# Patient Record
Sex: Female | Born: 1955 | Race: White | Hispanic: No | Smoking: Never smoker
Health system: Southern US, Community
[De-identification: ages and names within clinical notes are randomized; demographics above are authoritative.]

## PROBLEM LIST (undated history)

## (undated) DIAGNOSIS — Q638 Other specified congenital malformations of kidney: Secondary | ICD-10-CM

## (undated) DIAGNOSIS — T7840XA Allergy, unspecified, initial encounter: Secondary | ICD-10-CM

## (undated) DIAGNOSIS — I10 Essential (primary) hypertension: Secondary | ICD-10-CM

## (undated) DIAGNOSIS — J029 Acute pharyngitis, unspecified: Secondary | ICD-10-CM

## (undated) DIAGNOSIS — B001 Herpesviral vesicular dermatitis: Secondary | ICD-10-CM

## (undated) HISTORY — PX: TUBAL LIGATION: SHX77

## (undated) HISTORY — DX: Other specified congenital malformations of kidney: Q63.8

## (undated) HISTORY — DX: Herpesviral vesicular dermatitis: B00.1

## (undated) HISTORY — DX: Allergy, unspecified, initial encounter: T78.40XA

## (undated) HISTORY — DX: Essential (primary) hypertension: I10

## (undated) HISTORY — DX: Acute pharyngitis, unspecified: J02.9

---

## 1998-06-07 ENCOUNTER — Other Ambulatory Visit: Admission: RE | Admit: 1998-06-07 | Discharge: 1998-06-07 | Payer: Self-pay | Admitting: Obstetrics and Gynecology

## 1999-02-05 ENCOUNTER — Other Ambulatory Visit: Admission: RE | Admit: 1999-02-05 | Discharge: 1999-02-05 | Payer: Self-pay | Admitting: *Deleted

## 2000-02-09 ENCOUNTER — Other Ambulatory Visit: Admission: RE | Admit: 2000-02-09 | Discharge: 2000-02-09 | Payer: Self-pay | Admitting: *Deleted

## 2001-03-29 ENCOUNTER — Other Ambulatory Visit: Admission: RE | Admit: 2001-03-29 | Discharge: 2001-03-29 | Payer: Self-pay | Admitting: *Deleted

## 2002-07-03 ENCOUNTER — Other Ambulatory Visit: Admission: RE | Admit: 2002-07-03 | Discharge: 2002-07-03 | Payer: Self-pay | Admitting: Obstetrics & Gynecology

## 2003-08-08 ENCOUNTER — Other Ambulatory Visit: Admission: RE | Admit: 2003-08-08 | Discharge: 2003-08-08 | Payer: Self-pay | Admitting: Obstetrics & Gynecology

## 2004-08-01 ENCOUNTER — Emergency Department (HOSPITAL_COMMUNITY): Admission: EM | Admit: 2004-08-01 | Discharge: 2004-08-01 | Payer: Self-pay | Admitting: Emergency Medicine

## 2008-04-20 ENCOUNTER — Ambulatory Visit: Payer: Self-pay | Admitting: Family Medicine

## 2008-07-06 ENCOUNTER — Ambulatory Visit: Payer: Self-pay | Admitting: Family Medicine

## 2008-07-20 ENCOUNTER — Ambulatory Visit: Payer: Self-pay | Admitting: Family Medicine

## 2008-10-22 ENCOUNTER — Ambulatory Visit: Payer: Self-pay | Admitting: Family Medicine

## 2008-11-19 LAB — HM COLONOSCOPY: HM Colonoscopy: NORMAL

## 2009-05-17 ENCOUNTER — Ambulatory Visit: Payer: Self-pay | Admitting: Family Medicine

## 2009-08-03 LAB — HM DEXA SCAN

## 2009-09-09 ENCOUNTER — Ambulatory Visit: Payer: Self-pay | Admitting: Family Medicine

## 2010-02-11 ENCOUNTER — Ambulatory Visit: Payer: Self-pay | Admitting: Family Medicine

## 2010-08-27 ENCOUNTER — Ambulatory Visit: Payer: Self-pay | Admitting: Family Medicine

## 2010-11-09 DIAGNOSIS — Q638 Other specified congenital malformations of kidney: Secondary | ICD-10-CM

## 2010-11-09 HISTORY — DX: Other specified congenital malformations of kidney: Q63.8

## 2010-12-14 ENCOUNTER — Emergency Department (HOSPITAL_COMMUNITY)
Admission: EM | Admit: 2010-12-14 | Discharge: 2010-12-14 | Disposition: A | Discharge status: Home / Self Care | Payer: BC Managed Care – PPO | Attending: Emergency Medicine | Admitting: Emergency Medicine

## 2010-12-14 ENCOUNTER — Emergency Department (HOSPITAL_COMMUNITY): Payer: BC Managed Care – PPO

## 2010-12-14 DIAGNOSIS — I10 Essential (primary) hypertension: Secondary | ICD-10-CM | POA: Insufficient documentation

## 2010-12-14 DIAGNOSIS — N200 Calculus of kidney: Secondary | ICD-10-CM | POA: Insufficient documentation

## 2010-12-14 DIAGNOSIS — R109 Unspecified abdominal pain: Secondary | ICD-10-CM | POA: Insufficient documentation

## 2010-12-14 DIAGNOSIS — N1330 Unspecified hydronephrosis: Secondary | ICD-10-CM | POA: Insufficient documentation

## 2010-12-14 DIAGNOSIS — R935 Abnormal findings on diagnostic imaging of other abdominal regions, including retroperitoneum: Secondary | ICD-10-CM | POA: Insufficient documentation

## 2010-12-14 DIAGNOSIS — F411 Generalized anxiety disorder: Secondary | ICD-10-CM | POA: Insufficient documentation

## 2010-12-14 DIAGNOSIS — Q628 Other congenital malformations of ureter: Secondary | ICD-10-CM | POA: Insufficient documentation

## 2010-12-14 LAB — URINALYSIS, ROUTINE W REFLEX MICROSCOPIC
Bilirubin Urine: NEGATIVE
Hgb urine dipstick: NEGATIVE
Ketones, ur: NEGATIVE mg/dL
Nitrite: NEGATIVE
Protein, ur: NEGATIVE mg/dL
Specific Gravity, Urine: 1.019 (ref 1.005–1.030)
Urine Glucose, Fasting: NEGATIVE mg/dL
Urobilinogen, UA: 0.2 mg/dL (ref 0.0–1.0)
pH: 7 (ref 5.0–8.0)

## 2010-12-14 LAB — POCT I-STAT, CHEM 8
BUN: 17 mg/dL (ref 6–23)
Calcium, Ion: 1.11 mmol/L — ABNORMAL LOW (ref 1.12–1.32)
Chloride: 105 mEq/L (ref 96–112)
Creatinine, Ser: 1.2 mg/dL (ref 0.4–1.2)
Glucose, Bld: 119 mg/dL — ABNORMAL HIGH (ref 70–99)
HCT: 42 % (ref 36.0–46.0)
Hemoglobin: 14.3 g/dL (ref 12.0–15.0)
Potassium: 3.7 mEq/L (ref 3.5–5.1)
Sodium: 141 mEq/L (ref 135–145)
TCO2: 26 mmol/L (ref 0–100)

## 2010-12-15 LAB — URINE CULTURE
Colony Count: NO GROWTH
Culture  Setup Time: 201202051117
Culture: NO GROWTH

## 2010-12-17 ENCOUNTER — Other Ambulatory Visit: Payer: Self-pay | Admitting: Family Medicine

## 2010-12-17 DIAGNOSIS — K8689 Other specified diseases of pancreas: Secondary | ICD-10-CM

## 2010-12-19 ENCOUNTER — Ambulatory Visit
Admission: RE | Admit: 2010-12-19 | Discharge: 2010-12-19 | Disposition: A | Discharge status: Home / Self Care | Payer: BC Managed Care – PPO | Source: Ambulatory Visit | Attending: Family Medicine | Admitting: Family Medicine

## 2010-12-19 DIAGNOSIS — K8689 Other specified diseases of pancreas: Secondary | ICD-10-CM

## 2010-12-19 MED ORDER — IOHEXOL 350 MG/ML SOLN
100.0000 mL | Freq: Once | INTRAVENOUS | Status: AC | PRN
  Administered 2010-12-19: 100 mL via INTRAVENOUS

## 2010-12-22 ENCOUNTER — Other Ambulatory Visit (HOSPITAL_COMMUNITY): Payer: BC Managed Care – PPO

## 2011-01-02 ENCOUNTER — Ambulatory Visit (HOSPITAL_COMMUNITY): Payer: BC Managed Care – PPO | Attending: Urology

## 2011-01-02 ENCOUNTER — Ambulatory Visit (HOSPITAL_BASED_OUTPATIENT_CLINIC_OR_DEPARTMENT_OTHER)
Admission: RE | Admit: 2011-01-02 | Discharge: 2011-01-02 | Disposition: A | Discharge status: Home / Self Care | Payer: BC Managed Care – PPO | Source: Ambulatory Visit | Attending: Urology | Admitting: Urology

## 2011-01-02 DIAGNOSIS — N1370 Vesicoureteral-reflux, unspecified: Secondary | ICD-10-CM | POA: Insufficient documentation

## 2011-01-02 DIAGNOSIS — Z01818 Encounter for other preprocedural examination: Secondary | ICD-10-CM | POA: Insufficient documentation

## 2011-01-02 DIAGNOSIS — N2889 Other specified disorders of kidney and ureter: Secondary | ICD-10-CM | POA: Insufficient documentation

## 2011-01-02 DIAGNOSIS — N200 Calculus of kidney: Secondary | ICD-10-CM | POA: Insufficient documentation

## 2011-01-02 DIAGNOSIS — Z87891 Personal history of nicotine dependence: Secondary | ICD-10-CM | POA: Insufficient documentation

## 2011-01-02 DIAGNOSIS — Q649 Congenital malformation of urinary system, unspecified: Secondary | ICD-10-CM | POA: Insufficient documentation

## 2011-01-02 DIAGNOSIS — I10 Essential (primary) hypertension: Secondary | ICD-10-CM | POA: Insufficient documentation

## 2011-01-02 LAB — POCT I-STAT 4, (NA,K, GLUC, HGB,HCT)
Glucose, Bld: 100 mg/dL — ABNORMAL HIGH (ref 70–99)
HCT: 41 % (ref 36.0–46.0)
Hemoglobin: 13.9 g/dL (ref 12.0–15.0)
Potassium: 3.4 mEq/L — ABNORMAL LOW (ref 3.5–5.1)
Sodium: 143 mEq/L (ref 135–145)

## 2011-01-05 NOTE — Op Note (Signed)
[name], [name]                 ACCOUNT NO.:  [id number]  MEDICAL RECORD NO.:  [id number]           PATIENT TYPE:  E  LOCATION:  [facility]                         FACILITY:  [facility]  PHYSICIAN:  Lanise Mergen C. Vernie Shea, M.D.  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 3. Left renal calculi.  POSTOPERATIVE DIAGNOSES: 1. Complete duplication on the left-hand side. 2. Left lower pole moiety vesicoureteral reflux. 3. Left upper pole moiety ureterocele. 4. Left upper pole moiety calculi.  PROCEDURE: 1. Cystoscopy with retrograde pyelogram including interpretation. 2. Diagnostic ureteroscopy of the lower pole moiety. 3. Incision of upper pole moiety ureterocele. 4. Upper pole ureteroscopy with stone extraction.  SURGEON:  Dajah Fischman C. Vernie Ammons, MD  ANESTHESIA:  General.  BLOOD LOSS:  Less than 5 cc.  DRAINS:  None. SPECIMENS:  None.  COMPLICATIONS:  None.  INDICATIONS:  The patient is a 55 year old female who developed left flank pain acutely.  A CT scan revealed what appeared to be duplication that was most likely complete with an associated ureterocele and loss of renal parenchyma as well as renal calculi.  She also appeared to have ureteral calculi and I, therefore, have discussed the treatment options with her and she elected to proceed with surgical treatment.  DESCRIPTION OF OPERATION:  After informed consent, the patient was brought to the major OR, placed on the table, administered general anesthesia and then moved to the dorsal lithotomy position.  Genitalia was sterilely prepped and draped and an official time-out was then performed.  Initially, a 22-French cystoscope was passed into the bladder and the bladder was fully and systematically inspected.  No tumor, stones or inflammatory lesions were identified.   The right ureteral orifice was noted be of normal configuration and position.  On the left-hand side with observation, she had a ballooning left ureterocele.  Superior and lateral to the ureterocele was a gaping left ureteral orifice.  The ureteral orifice associated with ureterocele could not be identified.  I therefore administered 1 ampule of indigo carmine and noted blue efflux from the gaping orifice on the left-hand side, but could not see any contrast coming from the ureterocele.  A 6-French open-ended catheter was then passed through the cystoscope and into the gaping left ureteral orifice and full strength contrast was injected for retrograde pyelogram.  It revealed a normal-appearing ureter without dilatation throughout its course.  There was no evidence of filling defect or mass effect.  The ureter was associated with the lower pole and there were no abnormalities of the lower pole with sharp calyces noted.  I attempted to pass a guidewire through the open-ended ureteral catheter and probed at an area where I thought the ureteral orifice should be on the left-hand side over the ureterocele, but was unable to negotiate any guidewire into an orifice.  I, therefore, elected to proceed with incision of ureterocele.  The urethra was dilated with female sounds gently to 30-French.  I then passed a 28-French resectoscope  sheath with Timberlake obturator in the bladder.  The Timberlake obturator was then removed and the resectoscope element, 12-degree lens and Collins knife were inserted.  I then used the [employer] to incise at the inferior border of the ureterocele after waiting for it to peristalse and balloon up.  In doing this, there was almost a balloon-popping type of effect as the ureterocele was entered.  This was photographed and revealed a widely patent opening with a flap-like portion of the ureterocele over the ureteral orifice and what appeared to be a dilated  ureter.  The ureteroscope was then passed into the bladder and under direct vision, this was passed into the ureterocele, up the upper pole ureter and into the upper pole moiety.  I noted no obstruction or stones along the way, so I then performed a retrograde pyelogram.  Retrograde pyelogram of the upper pole was then performed by injecting full-strength contrast and noting blunting of the calyces with dilatation of the ureter throughout its course.  Ureteroscopy was then performed of the upper pole and I noted several stones.  These were photographed and the largest were grasped with nitinol basket.  The 2 largest stones were removed with nitinol basket and found to be approximately 1 mm in size.  The remainder of the stones were smaller and were not felt to require extraction, as they would likely pass through the widely patent orifice now.  I then passed the flexible ureteroscope up the lower pole ureter and inspected each calix and noted no tumors or stones.  This was then removed.  I inspected the area of incision and there was no active bleeding and therefore, the bladder was drained and the patient was awakened and taken to recovery room in stable and satisfactory condition.  She tolerated the procedure well and there were no intraoperative complications.  She will be given a prescription for Vicodin HP #28 and Pyridium 200 mg #40 and follow up in my office in 1 week.     [name], M.D.     MCO/MEDQ  D:  01/02/2011  T:  01/02/2011  Job:  [id number]  Electronically Signed by Ihor Gully M.D. on 01/05/2011 09:29:14 PM

## 2011-04-20 ENCOUNTER — Other Ambulatory Visit: Payer: Self-pay | Admitting: Family Medicine

## 2011-04-30 ENCOUNTER — Other Ambulatory Visit: Payer: Self-pay

## 2011-04-30 MED ORDER — CARVEDILOL 6.25 MG PO TABS: 6.2500 mg | ORAL_TABLET | Freq: Two times a day (BID) | ORAL | Status: DC

## 2011-06-14 ENCOUNTER — Other Ambulatory Visit: Payer: Self-pay | Admitting: Family Medicine

## 2011-07-15 ENCOUNTER — Other Ambulatory Visit: Payer: Self-pay | Admitting: Family Medicine

## 2011-07-18 ENCOUNTER — Other Ambulatory Visit: Payer: Self-pay | Admitting: Family Medicine

## 2011-08-06 LAB — HM MAMMOGRAPHY

## 2011-08-11 ENCOUNTER — Encounter: Payer: Self-pay | Admitting: Family Medicine

## 2011-08-13 ENCOUNTER — Other Ambulatory Visit: Payer: Self-pay | Admitting: Family Medicine

## 2011-09-14 ENCOUNTER — Telehealth: Payer: Self-pay | Admitting: Family Medicine

## 2011-09-14 MED ORDER — FLUTICASONE PROPIONATE 50 MCG/ACT NA SUSP: 2.0000 | Freq: Every day | NASAL | Status: DC

## 2011-09-14 NOTE — Telephone Encounter (Signed)
Pt wants refill on flonase generic  [facility]  90 day supply

## 2011-10-08 ENCOUNTER — Telehealth: Payer: Self-pay | Admitting: Family Medicine

## 2011-10-08 DIAGNOSIS — Z789 Other specified health status: Secondary | ICD-10-CM

## 2011-10-08 MED ORDER — FLUTICASONE PROPIONATE 50 MCG/ACT NA SUSP: 2.0000 | Freq: Every day | NASAL | Status: DC

## 2011-10-08 NOTE — Telephone Encounter (Signed)
RX WAS ORDERED AND SENT TO THE PHARMACY. CLS

## 2011-10-16 ENCOUNTER — Other Ambulatory Visit: Payer: Self-pay | Admitting: Family Medicine

## 2011-11-25 ENCOUNTER — Telehealth: Payer: Self-pay | Admitting: Family Medicine

## 2011-11-25 NOTE — Telephone Encounter (Signed)
I don't see that she has been in in the last year. Check and make sure if that is correct, she needs an appointment

## 2011-11-25 NOTE — Telephone Encounter (Signed)
PT HAS APPT FEB 8TH

## 2011-12-11 ENCOUNTER — Encounter: Payer: Self-pay | Admitting: Medical

## 2011-12-11 ENCOUNTER — Ambulatory Visit (INDEPENDENT_AMBULATORY_CARE_PROVIDER_SITE_OTHER): Payer: Managed Care, Other (non HMO) | Admitting: Medical

## 2011-12-11 VITALS — BP 122/80 | HR 68 | Temp 98.2°F | Resp 16 | Wt 177.0 lb

## 2011-12-11 DIAGNOSIS — J069 Acute upper respiratory infection, unspecified: Secondary | ICD-10-CM

## 2011-12-11 DIAGNOSIS — J029 Acute pharyngitis, unspecified: Secondary | ICD-10-CM

## 2011-12-11 HISTORY — DX: Acute pharyngitis, unspecified: J02.9

## 2011-12-11 MED ORDER — HYDROCODONE-HOMATROPINE 5-1.5 MG/5ML PO SYRP: 5.0000 mL | ORAL_SOLUTION | Freq: Four times a day (QID) | ORAL | Status: AC | PRN

## 2011-12-11 MED ORDER — AZITHROMYCIN 500 MG PO TABS: 500.0000 mg | ORAL_TABLET | Freq: Every day | ORAL | Status: AC

## 2011-12-11 NOTE — Patient Instructions (Signed)

## 2011-12-11 NOTE — Progress Notes (Signed)
Subjective:   [name] is a 56 y.o. female who presents for 5 day hx/o cough, had fever to 101 for two days this week, really bad cough yesterday, couldn't sleep last night due to cough.  Fever down to 99 yesterday.  Throat feels bad, is swollen, only tolerating liquids by swallowing, she notes sinus pressure and congestion.  She has had left ear pressure, and her grandson was a sick contacts.  No other aggravating or relieving factors.  No other c/o.  Past Medical History  Diagnosis Date  . Hypertension   . Allergy     RHINITIS  . Herpes labialis   . 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   ROS: Gen: no wt changes Lungs: no SOB, wheezing,  GI: no abdominal pain, NVD   Objective:   Filed Vitals:   12/11/11 1153  BP: 122/80  Pulse: 68  Temp: 98.2 F (36.8 C)  Resp: 16    General appearance: Alert, WD/WN, no distress, ill appearing                             Skin: warm, no rash                           Head: no sinus tenderness                            Eyes: conjunctiva normal, corneas clear, PERRLA                            Ears: pearly TMs, external ear canals normal                          Nose: septum midline, turbinates swollen, with erythema and clear discharge             Mouth/throat: MMM, tongue normal, mild pharyngeal erythema                           Neck: supple, supple, shoddy bilat adenopathy, no thyromegaly, nontender                          Heart: RRR, normal S1, S2, no murmurs                         Lungs: CTA bilaterally, no wheezes, rales, or rhonchi     Assessment and Plan:   Encounter Diagnoses  Name Primary?  . URI (upper respiratory infection) Yes  . Pharyngitis     Discussed diagnosis and treatment of URI.  Suggested symptomatic OTC remedies. Nasal saline spray for congestion.  Tylenol or Ibuprofen OTC for fever and malaise.   If worse in the next few days with fever over 102, worse colored sputum, then begin antibiotic.  Script give for  Azithromycin and Hycodan syrup today. Call/return in 2-3 days if symptoms aren't resolving.

## 2011-12-17 ENCOUNTER — Encounter: Payer: Self-pay | Admitting: Internal Medicine

## 2011-12-18 ENCOUNTER — Encounter: Payer: BC Managed Care – PPO | Admitting: Family Medicine

## 2011-12-23 ENCOUNTER — Ambulatory Visit (INDEPENDENT_AMBULATORY_CARE_PROVIDER_SITE_OTHER): Payer: Managed Care, Other (non HMO) | Admitting: Family Medicine

## 2011-12-23 ENCOUNTER — Encounter: Payer: Self-pay | Admitting: Family Medicine

## 2011-12-23 DIAGNOSIS — J309 Allergic rhinitis, unspecified: Secondary | ICD-10-CM

## 2011-12-23 DIAGNOSIS — I10 Essential (primary) hypertension: Secondary | ICD-10-CM

## 2011-12-23 DIAGNOSIS — J302 Other seasonal allergic rhinitis: Secondary | ICD-10-CM

## 2011-12-23 DIAGNOSIS — Z87442 Personal history of urinary calculi: Secondary | ICD-10-CM

## 2011-12-23 DIAGNOSIS — Z8619 Personal history of other infectious and parasitic diseases: Secondary | ICD-10-CM

## 2011-12-23 DIAGNOSIS — Z0000 Encounter for general adult medical examination without abnormal findings: Secondary | ICD-10-CM

## 2011-12-23 MED ORDER — FLUTICASONE PROPIONATE 50 MCG/ACT NA SUSP: 2.0000 | Freq: Every day | NASAL | Status: DC

## 2011-12-23 MED ORDER — HYDROCHLOROTHIAZIDE 12.5 MG PO CAPS: 12.5000 mg | ORAL_CAPSULE | Freq: Every day | ORAL | Status: DC

## 2011-12-23 NOTE — Progress Notes (Signed)
  Subjective:    Patient ID: [name], female    DOB: 1956-02-06, 56 y.o.   MRN: [id number]  HPI She is here for complete examination. She does have her previous history of kidney stones but has had none recently. She does have underlying allergies and presently is on Singulair and Flonase. She continues on hydrochlorothiazide for her hypertension. Her husband died 2 years ago. She is recovering from this. She has been through the hospice bereavement program. She still feels slightly depressed and during this timeframe has gained some weight. Does occasionally have difficulty with herpes labialis. She has a history of colonic polyps however was seen by Dr. Kinnie Scales for this and apparently the colonoscopy was negative.   Review of Systems     Objective:   Physical Exam BP 130/80  Pulse 65  Ht 5\' 2"  (1.575 m)  Wt 174 lb (78.926 kg)  BMI 31.83 kg/m2  General Appearance:    Alert, cooperative, no distress, appears stated age  Head:    Normocephalic, without obvious abnormality, atraumatic  Eyes:    PERRL, conjunctiva/corneas clear, EOM's intact, fundi    benign  Ears:    Normal TM's and external ear canals  Nose:   Nares normal, mucosa normal, no drainage or sinus   tenderness  Throat:   Lips, mucosa, and tongue normal; teeth and gums normal  Neck:   Supple, no lymphadenopathy;  thyroid:  no   enlargement/tenderness/nodules; no carotid   bruit or JVD  Back:    Spine nontender, no curvature, ROM normal, no CVA     tenderness  Lungs:     Clear to auscultation bilaterally without wheezes, rales or     ronchi; respirations unlabored  Chest Wall:    No tenderness or deformity   Heart:    Regular rate and rhythm, S1 and S2 normal, no murmur, rub   or gallop  Breast Exam:    Deferred to GYN  Abdomen:     Soft, non-tender, nondistended, normoactive bowel sounds,    no masses, no hepatosplenomegaly  Genitalia:    Deferred to GYN     Extremities:   No clubbing, cyanosis or edema  Pulses:    2+ and symmetric all extremities  Skin:   Skin color, texture, turgor normal, no rashes or lesions  Lymph nodes:   Cervical, supraclavicular, and axillary nodes normal  Neurologic:   CNII-XII intact, normal strength, sensation and gait; reflexes 2+ and symmetric throughout          Psych:   Normal mood, affect, hygiene and grooming.           Assessment & Plan:   1. History of kidney stones    2. Allergic rhinitis, seasonal    3. Routine general medical examination at a health care facility    4. Hypertension  hydrochlorothiazide (MICROZIDE) 12.5 MG capsule  5. History of herpes labialis     continue on her present medications. Discussed her new lifestyle now being widowed. She seems to be handling this well and making progress. Also strongly encouraged her to slowly increase her physical activities taking small steps initially. Recommend cutting back on carbohydrates as well.

## 2011-12-23 NOTE — Patient Instructions (Signed)
start your exercise program but take baby steps

## 2011-12-24 ENCOUNTER — Telehealth: Payer: Self-pay

## 2011-12-24 NOTE — Telephone Encounter (Signed)
LEFT MESSAGE FOR PT THAT I WAS SENDING HER A COPY OF LABS AND DIET INFO PER [facility] MAILED

## 2012-01-04 ENCOUNTER — Telehealth: Payer: Self-pay | Admitting: Internal Medicine

## 2012-01-04 NOTE — Telephone Encounter (Signed)
If she's still having difficulty, she will need to make an appointment for further evaluation

## 2012-01-04 NOTE — Telephone Encounter (Signed)
Pt informed and wants to wait and see how this week goes

## 2012-01-08 ENCOUNTER — Ambulatory Visit: Payer: Managed Care, Other (non HMO) | Admitting: Family Medicine

## 2012-02-10 ENCOUNTER — Other Ambulatory Visit: Payer: Self-pay | Admitting: Family Medicine

## 2012-03-04 ENCOUNTER — Encounter: Payer: Self-pay | Admitting: Family Medicine

## 2012-04-18 ENCOUNTER — Ambulatory Visit (INDEPENDENT_AMBULATORY_CARE_PROVIDER_SITE_OTHER): Payer: Managed Care, Other (non HMO) | Admitting: Family Medicine

## 2012-04-18 ENCOUNTER — Encounter: Payer: Self-pay | Admitting: Family Medicine

## 2012-04-18 VITALS — BP 134/80 | HR 60 | Temp 97.6°F | Ht 62.0 in | Wt 171.0 lb

## 2012-04-18 DIAGNOSIS — T148XXA Other injury of unspecified body region, initial encounter: Secondary | ICD-10-CM

## 2012-04-18 DIAGNOSIS — S40869A Insect bite (nonvenomous) of unspecified upper arm, initial encounter: Secondary | ICD-10-CM

## 2012-04-18 DIAGNOSIS — IMO0001 Reserved for inherently not codable concepts without codable children: Secondary | ICD-10-CM

## 2012-04-18 DIAGNOSIS — W57XXXA Bitten or stung by nonvenomous insect and other nonvenomous arthropods, initial encounter: Secondary | ICD-10-CM

## 2012-04-18 NOTE — Patient Instructions (Signed)
Call for antibiotics if you develop fever, increasing redness, red streaks up the arm, increasing pain.  This appears to be a local reaction to an insect bite.  Treat with ice, antihistamines (ie alavert or benadryl) and hydrocortisone cream as needed for the itching (ie Cortaid 1%)

## 2012-04-18 NOTE — Progress Notes (Signed)
Chief Complaint  Patient presents with  . Insect Bite    either Sat and Wynelle Link got a insect bite on her right arm-today it is itchy and hard to the touch. Does not remember getting bit.    HPI: Outside with granddaughter over the weekend.  Didn't notice or feel any bite, until yesterday when it started itching at her R elbow.  Has gotten more swollen since yesterday.  Not painful, no drainage, just very itchy and increasing in size/swelling.  Using anibacterial ointment.  Hasn't used other treatments for the itching, or been able to ice it.  Has been working.  Past Medical History  Diagnosis Date  . Hypertension   . Allergy     RHINITIS  . Herpes labialis   . 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 Pharyngitis 12/11/2011   History   Social History  . Marital Status: Widowed    Spouse Name: N/A    Number of Children: N/A  . Years of Education: N/A   Occupational History  . Not on file.   Social History Main Topics  . Smoking status: Former Smoker    Quit date: 12/10/1981  . Smokeless tobacco: Not on file  . Alcohol Use: No  . Drug Use: No  . Sexually Active: Not Currently   Other Topics Concern  . Not on file   Social History Narrative  . No narrative on file   Current Outpatient Prescriptions on File Prior to Visit  Medication Sig Dispense Refill  . Ascorbic Acid (VITAMIN C) 1000 MG tablet Take 1,000 mg by mouth daily.      Marland Kitchen aspirin 81 MG tablet Take 81 mg by mouth daily.        . carvedilol (COREG) 6.25 MG tablet TAKE ONE TABLET BY MOUTH TWICE DAILY  60 tablet  9  . fluticasone (FLONASE) 50 MCG/ACT nasal spray Place 2 sprays into the nose daily. Three-month supply  16 g  3  . hydrochlorothiazide (MICROZIDE) 12.5 MG capsule Take 1 capsule (12.5 mg total) by mouth daily.  90 capsule  3  . magnesium gluconate (MAGONATE) 500 MG tablet Take 500 mg by mouth daily.        . valACYclovir (VALTREX) 500 MG tablet Take 500 mg by mouth as needed.        . montelukast (SINGULAIR) 10 MG  tablet Take 10 mg by mouth at bedtime.         No Known Allergies  ROS:  Denies fevers, cough, shortness of breath. Some allergies--uses singulair and alavert as needed, not recently. No GI complaints or other skin concerns.  PHYSICAL EXAM: BP 134/80  Pulse 60  Temp(Src) 97.6 F (36.4 C) (Oral)  Ht 5\' 2"  (1.575 m)  Wt 171 lb (77.565 kg)  BMI 31.28 kg/m2 Well developed, pleasant female in no distress. R elbow: 5 x 6 cm area of induration 6 x 7 cm area of redness.  Both marked with pen. No purulence, drainage, fluctuance, nontender.  ASSESSMENT/PLAN: 1. Insect bite of arm   no evidence of bacterial infection, just local reaction.  Call for antibiotics if you develop fever, increasing redness, red streaks up the arm, increasing pain.  This appears to be a local reaction to an insect bite.  Treat with ice, antihistamines (ie alavert or benadryl) and hydrocortisone cream as needed for the itching (ie Cortaid 1%)

## 2012-07-25 IMAGING — CT CT ABD-PELV W/O CM
2 of 4 series · 16 of 46 positions shown, 18 images · non-contrast
Comparison: None

CLINICAL DATA: Left-sided flank pain and nausea.

CT ABDOMEN AND PELVIS WITHOUT CONTRAST (CT UROGRAM)
TECHNIQUE: Contiguous axial images of the abdomen and pelvis
without oral or intravenous contrast were obtained.

[Series 2: stone_wo 5.0 b40f st · axial · 0.72mm/px · z∈[-362,+58]mm · 13 of 115 slices shown, 15 images]
[im 5/115  soft-tissue]
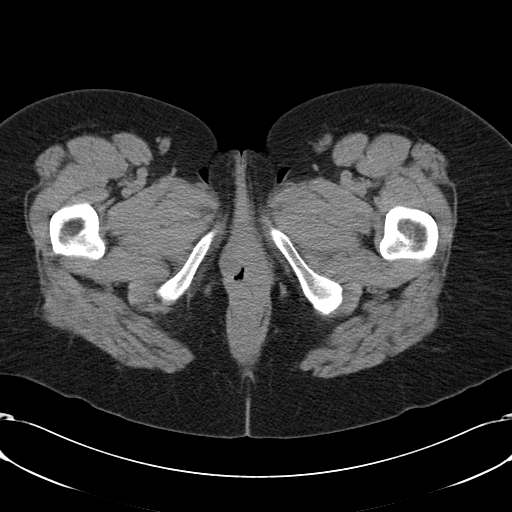
[im 5/115  bone]
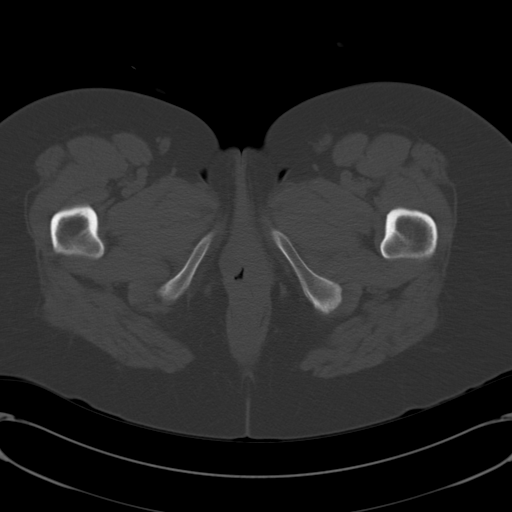
[im 14/115  soft-tissue]
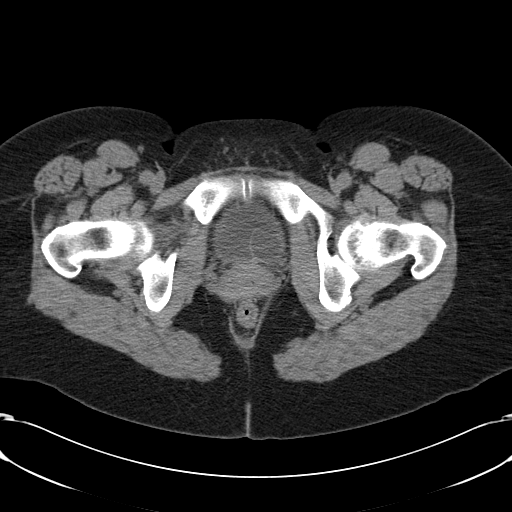
[im 23/115  soft-tissue]
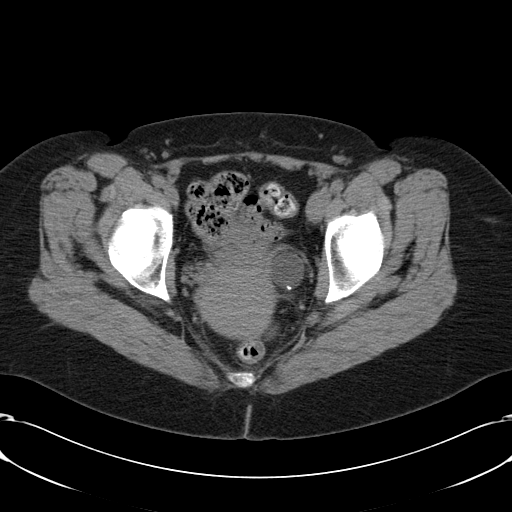
[im 32/115  soft-tissue]
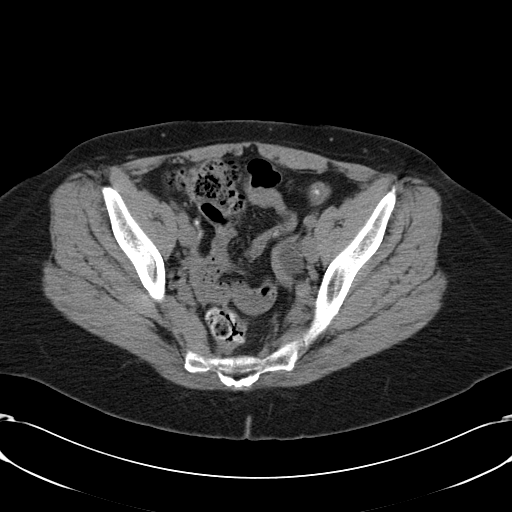
[im 42/115  soft-tissue]
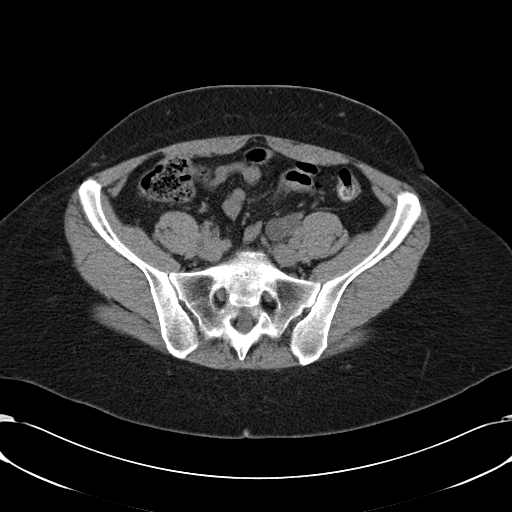
[im 51/115  soft-tissue]
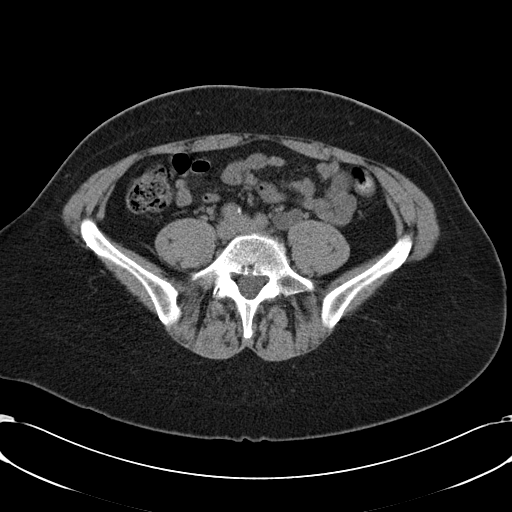
[im 60/115  soft-tissue]
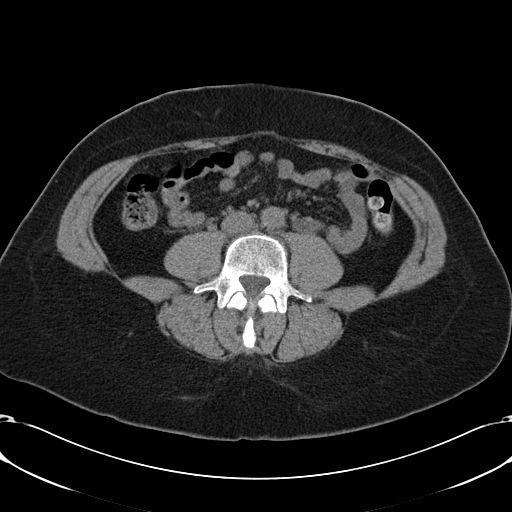
[im 64/115  soft-tissue]
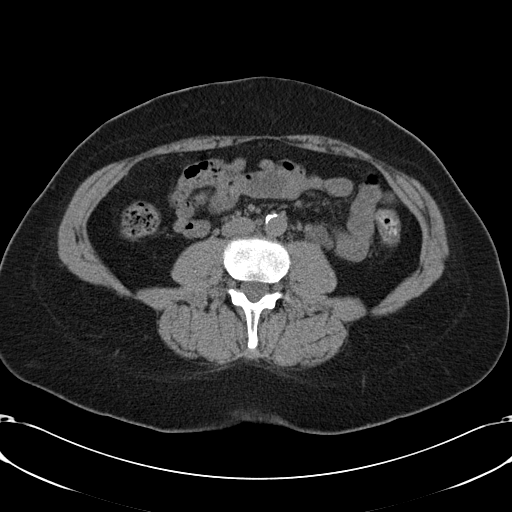
[im 73/115  soft-tissue]
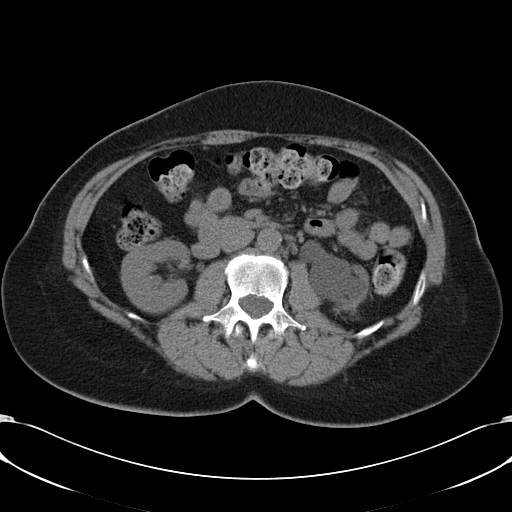
[im 73/115  bone]
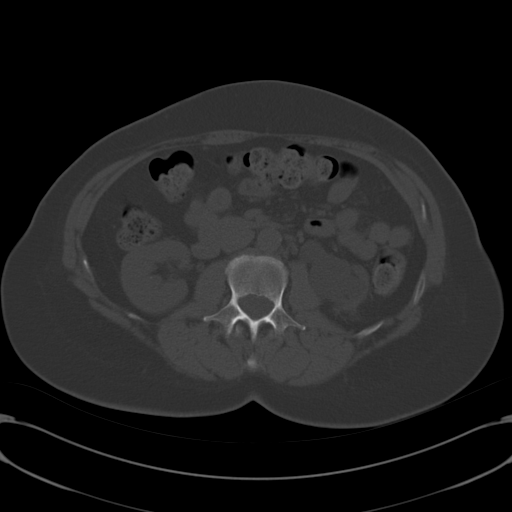
[im 83/115  soft-tissue]
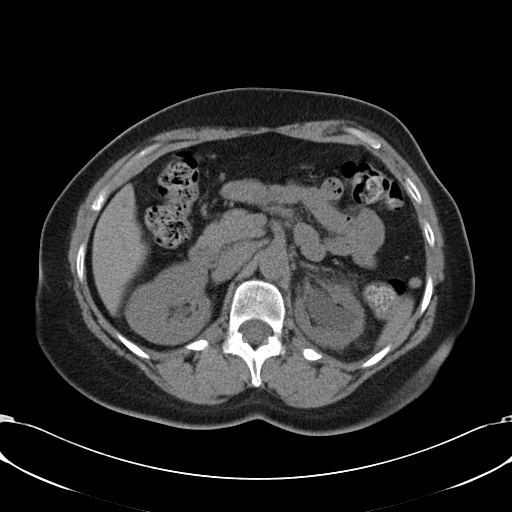
[im 92/115  soft-tissue]
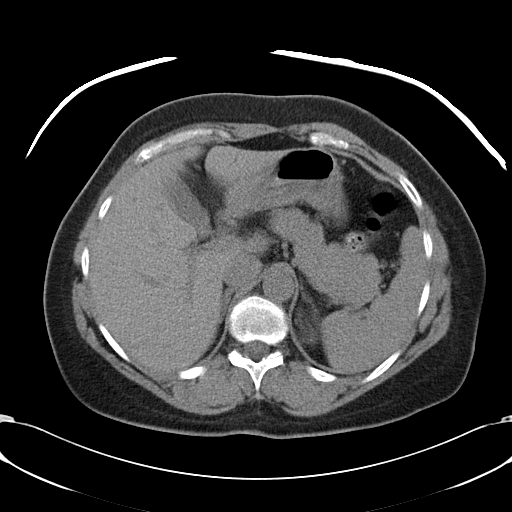
[im 101/115  soft-tissue]
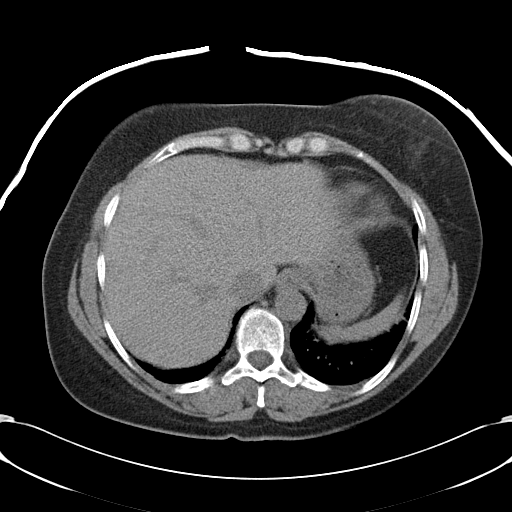
[im 110/115  soft-tissue]
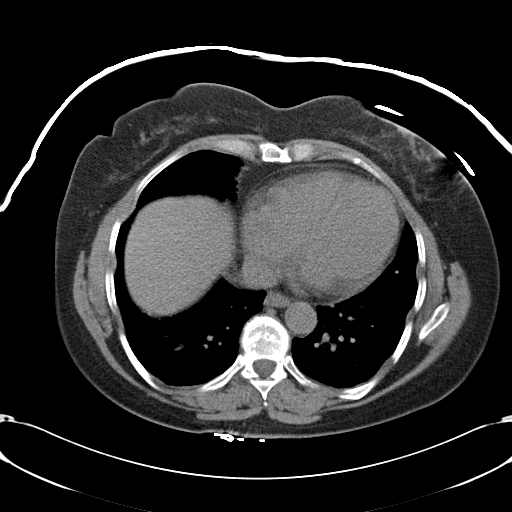

[Series 602: coronal · coronal · 0.93mm/px · 3 of 70 slices shown]
[im 24/70  soft-tissue]
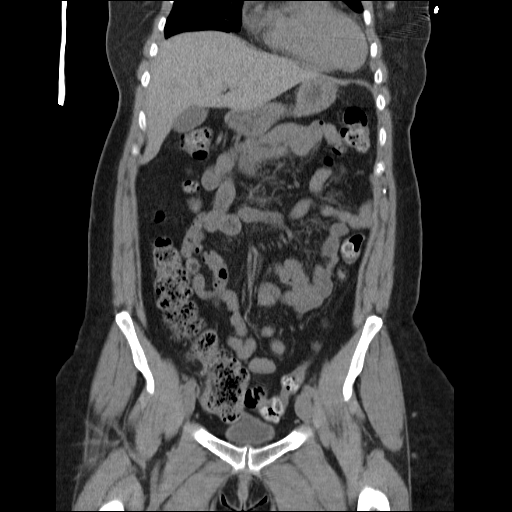
[im 31/70  soft-tissue]
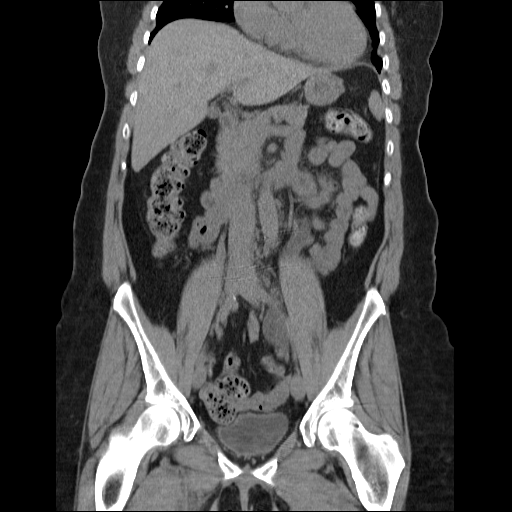
[im 39/70  soft-tissue]
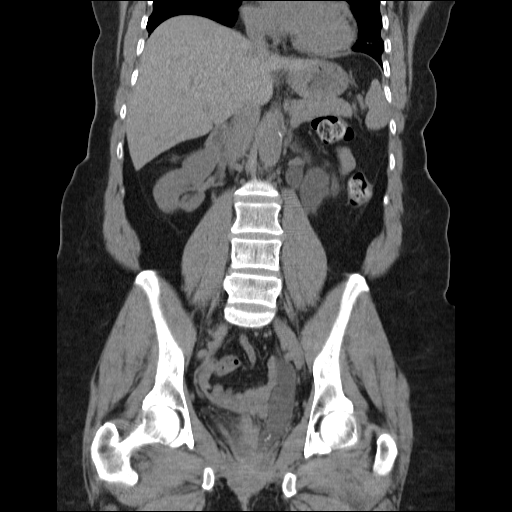

[16 of 46 positions shown; findings below may reference images not displayed]

FINDINGS: Exam is limited for evaluation of entities other than
urinary tract calculi due to lack of oral or intravenous contrast.

 Volume loss and interstitial thickening at the lung bases which is
nonspecific.  Mild cardiomegaly without pericardial or pleural
effusion.

Probable left hepatic lobe cyst.  Splenule.  Normal stomach.  Soft
tissue fullness within the pancreatic tail on image 24.  No
pancreatic ductal dilatation.  Normal gallbladder and biliary tree.
Normal adrenal glands.

Scarred upper pole right kidney.  Lower pole punctate right renal
calculus.  Scarred lower pole right kidney.  There is marked left
renal cortical thinning and likely chronic caliectasis.  There are
punctate left renal calculi.  At least partially duplicated left
renal collecting system.  The distal left ureter or upper moiety
ureter is markedly dilated.  Small stones identified within
including on images 92 and 100.  An orthotopic ureterocele is
identified on image 102 of series 2.  Mild left perirenal edema.

No pelvic adenopathy.    Normal colon, appendix, and terminal
ileum.  Normal small bowel without abdominal ascites.

  No pelvic adenopathy.  Normal uterus and adnexa without
significant free pelvic fluid No acute osseous abnormality.
IMPRESSION: 1.  Chronic left-sided urinary tract obstruction with cortical
thinning and caliectasis.  At least partially duplicated left renal
collecting system with an orthotopic ureterocele and small stones
involving the distal left ureter or upper pole ureteric moiety.
Consider outpatient follow-up with dedicated multiphase CT to
evaluate collecting system and ureteric anatomy.  Also consider non
emergent urology consultation.
2.  Soft tissue fullness within the pancreatic tail, suspicious for
mass.  Recommend further evaluation with multiphase dedicated
pancreatic protocol CT or MRI (with and without contrast). I
personally called and discussed this report with Dr. Rizwana  at
[DATE] hours  on 12/14/2010.
3.  Right renal calculus with areas of right-sided renal scarring.

## 2012-09-01 ENCOUNTER — Other Ambulatory Visit (INDEPENDENT_AMBULATORY_CARE_PROVIDER_SITE_OTHER): Payer: Managed Care, Other (non HMO)

## 2012-09-01 DIAGNOSIS — Z23 Encounter for immunization: Secondary | ICD-10-CM

## 2012-09-22 LAB — HM MAMMOGRAPHY

## 2012-09-23 ENCOUNTER — Encounter: Payer: Self-pay | Admitting: Family Medicine

## 2012-12-28 ENCOUNTER — Other Ambulatory Visit: Payer: Self-pay | Admitting: Family Medicine

## 2013-01-03 ENCOUNTER — Ambulatory Visit (INDEPENDENT_AMBULATORY_CARE_PROVIDER_SITE_OTHER): Payer: 59 | Admitting: Family Medicine

## 2013-01-03 ENCOUNTER — Encounter: Payer: Self-pay | Admitting: Family Medicine

## 2013-01-03 VITALS — BP 120/80 | HR 60 | Wt 171.0 lb

## 2013-01-03 DIAGNOSIS — J309 Allergic rhinitis, unspecified: Secondary | ICD-10-CM

## 2013-01-03 DIAGNOSIS — M8580 Other specified disorders of bone density and structure, unspecified site: Secondary | ICD-10-CM

## 2013-01-03 DIAGNOSIS — M899 Disorder of bone, unspecified: Secondary | ICD-10-CM

## 2013-01-03 DIAGNOSIS — B009 Herpesviral infection, unspecified: Secondary | ICD-10-CM

## 2013-01-03 DIAGNOSIS — I10 Essential (primary) hypertension: Secondary | ICD-10-CM

## 2013-01-03 DIAGNOSIS — M949 Disorder of cartilage, unspecified: Secondary | ICD-10-CM

## 2013-01-03 DIAGNOSIS — B001 Herpesviral vesicular dermatitis: Secondary | ICD-10-CM

## 2013-01-03 MED ORDER — VALACYCLOVIR HCL 500 MG PO TABS: 500.0000 mg | ORAL_TABLET | ORAL | Status: DC | PRN

## 2013-01-03 MED ORDER — TRETINOIN 0.025 % EX CREA: TOPICAL_CREAM | Freq: Every day | CUTANEOUS | Status: AC

## 2013-01-03 MED ORDER — HYDROCHLOROTHIAZIDE 12.5 MG PO CAPS: 12.5000 mg | ORAL_CAPSULE | Freq: Every day | ORAL | Status: DC

## 2013-01-03 MED ORDER — CARVEDILOL 6.25 MG PO TABS: ORAL_TABLET | ORAL | Status: DC

## 2013-01-03 NOTE — Progress Notes (Signed)
  Subjective:    Patient ID: [name], female    DOB: 1956/03/06, 57 y.o.   MRN: [id number]  HPI He is here for medication check. She does need her medications renewed for blood pressure. She does have a history of osteopenia and has been on vitamin D and calcium. This was apparently diagnosed by her gynecologist. She also is having difficulty with herpes labialis and would like a refill on her Valtrex. She has been under a lot of stress due to an impending move and usually this precipitates attacks. She does have underlying allergies and these are well controlled on her present medication regimen. She rarely uses Singulair.   Review of Systems     Objective:   Physical Exam Alert and in no distress otherwise not examined       Assessment & Plan:  Hypertension - Plan: carvedilol (COREG) 6.25 MG tablet, hydrochlorothiazide (MICROZIDE) 12.5 MG capsule  Herpes labialis - Plan: valACYclovir (VALTREX) 500 MG tablet  Osteopenia - Plan: DG Bone Density  Chronic allergic rhinitis her medications were renewed. I discussed health maintenance issues in regard to DEXA scan, mammogram, Pap smears.

## 2013-01-23 ENCOUNTER — Telehealth: Payer: Self-pay | Admitting: Family Medicine

## 2013-01-23 NOTE — Telephone Encounter (Signed)
Pt called and states she was her last ov that was a medcheck to be a CPE.  She said you did everything as a CPE.  She scheduled a med check for med refills and wants rx sent to her for labs to be drawn at [facility] her employer.  Please advise.

## 2013-01-23 NOTE — Telephone Encounter (Signed)
Explain the difference between CPE and a med check appointment.

## 2013-01-23 NOTE — Telephone Encounter (Signed)
Phoned pt advised her that she was here for a med check renewing her blood pressure meds.  Not a cpe.  Confirmed with Dr. Susann Givens.

## 2013-02-17 ENCOUNTER — Encounter: Payer: Self-pay | Admitting: Family Medicine

## 2013-03-27 ENCOUNTER — Encounter: Payer: Self-pay | Admitting: Family Medicine

## 2013-03-27 ENCOUNTER — Ambulatory Visit (INDEPENDENT_AMBULATORY_CARE_PROVIDER_SITE_OTHER): Payer: 59 | Admitting: Family Medicine

## 2013-03-27 VITALS — BP 120/70 | HR 70 | Wt 171.0 lb

## 2013-03-27 DIAGNOSIS — F329 Major depressive disorder, single episode, unspecified: Secondary | ICD-10-CM

## 2013-03-27 MED ORDER — CITALOPRAM HYDROBROMIDE 20 MG PO TABS: 20.0000 mg | ORAL_TABLET | Freq: Every day | ORAL | Status: DC

## 2013-03-27 NOTE — Progress Notes (Signed)
  Subjective:    Patient ID: [name], female    DOB: Jun 25, 1956, 57 y.o.   MRN: [id number]  HPI She is here for consult concerning stress and anxiety. She cites work, money issues, trying to move and so her house has major stressors. Also her husband died 3 years ago and she still adjusting to her .  She complains of sleep disturbance, fatigue, mood swings, generalized stress. She also notes difficulty with recurrent cold sores. She is starting to read a book on CBT. She did go through bereavement counseling when her husband died but states that she still having difficulty.   Review of Systems     Objective:   Physical Exam Alert and in no distress with appropriate affect and occasionally tearful.       Assessment & Plan:  Depressive disorder, not elsewhere classified I will place her on Celexa. Discussed the use of this medicine and also getting involved in counseling. She is very amenable to this and I will refer her to Darryl Hyers. She is to recheck with me in approximately one month. Discussed possible side effects of medication with her.

## 2013-03-27 NOTE — Patient Instructions (Signed)
[name] [phone] 

## 2013-03-31 ENCOUNTER — Telehealth: Payer: Self-pay | Admitting: Internal Medicine

## 2013-03-31 MED ORDER — SERTRALINE HCL 25 MG PO TABS: 25.0000 mg | ORAL_TABLET | Freq: Every day | ORAL | Status: DC

## 2013-03-31 NOTE — Telephone Encounter (Signed)
Left message for pt word for word  

## 2013-03-31 NOTE — Telephone Encounter (Signed)
She had difficulty with Celexa causing too much sedation. I will switch her to Zoloft 25 mg and increase based on her symptoms.

## 2013-03-31 NOTE — Telephone Encounter (Signed)
Pt states that celexa 20mg  is too much for her. She is tired and falling asleep at like 7 at night and tired all day and has been nausea some. She has tried spitting the pill in half and it still makes her tired. If you need to send another med send to [facility] on [address]

## 2013-03-31 NOTE — Telephone Encounter (Signed)
I called in a different medication. Have her start once per day and call after the weekend and let us know how she is doing.

## 2013-04-18 ENCOUNTER — Encounter: Payer: Self-pay | Admitting: Family Medicine

## 2013-05-01 ENCOUNTER — Telehealth: Payer: Self-pay | Admitting: Internal Medicine

## 2013-05-01 MED ORDER — FLUTICASONE PROPIONATE 50 MCG/ACT NA SUSP: 2.0000 | Freq: Every day | NASAL | Status: AC

## 2013-05-01 NOTE — Telephone Encounter (Signed)
SENT FLONASE IN

## 2013-05-01 NOTE — Telephone Encounter (Signed)
Refill request for fluticasone to [facility]

## 2013-06-05 ENCOUNTER — Other Ambulatory Visit: Payer: Self-pay | Admitting: Family Medicine

## 2013-08-03 ENCOUNTER — Other Ambulatory Visit: Payer: Self-pay | Admitting: Family Medicine

## 2013-10-09 ENCOUNTER — Telehealth: Payer: Self-pay | Admitting: Family Medicine

## 2013-10-09 DIAGNOSIS — B001 Herpesviral vesicular dermatitis: Secondary | ICD-10-CM

## 2013-10-09 MED ORDER — VALACYCLOVIR HCL 500 MG PO TABS: 500.0000 mg | ORAL_TABLET | ORAL | Status: AC | PRN

## 2013-10-09 NOTE — Telephone Encounter (Signed)
Pt [facility]

## 2013-10-09 NOTE — Telephone Encounter (Signed)
Valtrex renewed.  

## 2014-01-09 ENCOUNTER — Other Ambulatory Visit: Payer: Self-pay | Admitting: Family Medicine

## 2014-01-09 NOTE — Telephone Encounter (Signed)
Pt has transferred out and has a new doctor and will be seeing him on 3/30
